# Patient Record
Sex: Male | Born: 1955 | Race: Black or African American | Hispanic: No | Marital: Married | Smoking: Never smoker
Health system: Southern US, Community
[De-identification: ages and names within clinical notes are randomized; demographics above are authoritative.]

## PROBLEM LIST (undated history)

## (undated) DIAGNOSIS — G4730 Sleep apnea, unspecified: Secondary | ICD-10-CM

## (undated) DIAGNOSIS — M1990 Unspecified osteoarthritis, unspecified site: Secondary | ICD-10-CM

## (undated) DIAGNOSIS — D649 Anemia, unspecified: Secondary | ICD-10-CM

## (undated) DIAGNOSIS — K219 Gastro-esophageal reflux disease without esophagitis: Secondary | ICD-10-CM

## (undated) DIAGNOSIS — G8929 Other chronic pain: Secondary | ICD-10-CM

## (undated) DIAGNOSIS — I723 Aneurysm of iliac artery: Secondary | ICD-10-CM

## (undated) DIAGNOSIS — M549 Dorsalgia, unspecified: Secondary | ICD-10-CM

## (undated) HISTORY — PX: CHOLECYSTECTOMY: SHX55

## (undated) HISTORY — DX: Aneurysm of iliac artery: I72.3

## (undated) HISTORY — DX: Other chronic pain: G89.29

## (undated) HISTORY — DX: Sleep apnea, unspecified: G47.30

## (undated) HISTORY — DX: Unspecified osteoarthritis, unspecified site: M19.90

## (undated) HISTORY — DX: Dorsalgia, unspecified: M54.9

## (undated) HISTORY — DX: Gastro-esophageal reflux disease without esophagitis: K21.9

## (undated) HISTORY — DX: Anemia, unspecified: D64.9

---

## 2010-04-15 ENCOUNTER — Ambulatory Visit
Admission: RE | Admit: 2010-04-15 | Discharge: 2010-04-15 | Payer: Self-pay | Source: Home / Self Care | Attending: Vascular Surgery | Admitting: Vascular Surgery

## 2010-04-15 ENCOUNTER — Ambulatory Visit: Admit: 2010-04-15 | Payer: Self-pay | Admitting: Vascular Surgery

## 2010-04-16 ENCOUNTER — Encounter
Admission: RE | Admit: 2010-04-16 | Discharge: 2010-04-16 | Payer: Self-pay | Source: Home / Self Care | Attending: Neurosurgery | Admitting: Neurosurgery

## 2010-04-24 NOTE — Procedures (Unsigned)
DUPLEX ULTRASOUND OF ABDOMINAL AORTA  INDICATION:  Left common iliac artery aneurysm.  HISTORY: Diabetes:  No. Cardiac:  No. Hypertension:  No. Smoking:  No. Connective Tissue Disorder: Family History:  No. Previous Surgery:  No.  DUPLEX EXAM:         AP (cm)                   TRANSVERSE (cm) Proximal             2.8  cm                   2.9 cm Mid                  2.2 cm                    2.2 cm Distal               2.1 cm                    2.3 cm Right Iliac          1.6 cm                    1.4 cm Left Iliac           2.5 cm                    2.7 cm  PREVIOUS:  Date: 03/12/2010 (MRI)  AP:  Left CIA = 2.8  TRANSVERSE: Left CIA = 2.8  IMPRESSION: 1. No evidence of abdominal aortic aneurysm noted. 2. Aneurysmal dilatation noted in the left common iliac artery, based     on limited visualization due to overlying bowel gas patterns and     patient body habitus. 3. No significant change in maximum diameter of the left common iliac     artery noted when compared to the previous MRI.  ___________________________________________ Janetta Hora. Fields, MD  CH/MEDQ  D:  04/15/2010  T:  04/15/2010  Job:  [id number]

## 2010-04-29 ENCOUNTER — Encounter
Admission: RE | Admit: 2010-04-29 | Discharge: 2010-04-29 | Payer: Self-pay | Source: Home / Self Care | Attending: Neurosurgery | Admitting: Neurosurgery

## 2010-05-03 ENCOUNTER — Other Ambulatory Visit: Payer: Self-pay | Admitting: Neurosurgery

## 2010-05-03 DIAGNOSIS — M5410 Radiculopathy, site unspecified: Secondary | ICD-10-CM

## 2010-05-03 DIAGNOSIS — M549 Dorsalgia, unspecified: Secondary | ICD-10-CM

## 2010-05-21 ENCOUNTER — Inpatient Hospital Stay: Admission: RE | Admit: 2010-05-21 | Payer: Self-pay | Source: Ambulatory Visit

## 2010-05-28 ENCOUNTER — Ambulatory Visit
Admission: RE | Admit: 2010-05-28 | Discharge: 2010-05-28 | Disposition: A | Discharge status: Home / Self Care | Payer: 59 | Source: Ambulatory Visit | Attending: Neurosurgery | Admitting: Neurosurgery

## 2010-05-28 DIAGNOSIS — M549 Dorsalgia, unspecified: Secondary | ICD-10-CM

## 2010-05-28 DIAGNOSIS — M5410 Radiculopathy, site unspecified: Secondary | ICD-10-CM

## 2010-08-17 NOTE — Assessment & Plan Note (Signed)
OFFICE VISIT   BRYLEY, KOVACEVIC  DOB:  1955-05-27                                       04/15/2010  EAVWU#:[id number]   CHIEF COMPLAINT:  Iliac aneurysm.   HISTORY OF PRESENT ILLNESS:  The patient is a 55 year old male referred  by Dr. Benson Setting for evaluation of iliac artery aneurysm.  Aneurysm was  found incidentally on an MRI workup for back pain.  The patient denies  any family history of aneurysms.  He denies any abdominal pain.  He does  have chronic back pain.   He stated this was the initial diagnosis of his aneurysm.  Chronic  medical problems include gallop, sleep apnea and chronic leg swelling of  his left leg.  All of these problems are currently controlled and  followed by Dr. Benson Setting.  He denies history of diabetes or hypertension.Marland Kitchen   PAST SURGICAL HISTORY:  Cholecystectomy.   SOCIAL HISTORY:  He works as a Veterinary surgeon.  He is married and has 2  children.  He is a nonsmoker. He is a Tax adviser of alcohol.   FAMILY HISTORY:  Unremarkable for aneurysm or vascular disease at age  less than 37.   REVIEW OF SYSTEMS:  A full 12 point review of systems was performed with  the patient today,  GENERAL:  He is 5 foot 10 inches, 260 pounds.  GI:  Has history of reflux.  MUSCULOSKELETAL:  He has multiple joint arthritis pain.  All other systems are negative.   MEDICATIONS:  1. Lasix 40 mg twice a day.  2. Colchicine p.r.n.  3. Tramadol p.r.n.   ALLERGIES:  He has no known drug allergies.   PHYSICAL EXAMINATION:  Blood pressure 133/87 in the left arm, heart rate  is 86 and regular.  Temperature is 98.2.  HEENT:  Unremarkable.  Neck;  Has 2+ carotid pulses without bruit.  Chest:  Clear to auscultation.  Cardiac:  Regular rate and rhythm without murmur.  Abdomen:  Obese,  soft, nontender, nondistended.  No masses.  Extremities:  He has 2+  radial, 2+ femoral pulses bilaterally.  He has absent pedal pulses in  the right foot.  He has a 2+ dorsalis pedis  pulse in the left foot.  He  does have some deformity of right foot from previous crush injury.  Neurologic exam:  He has symmetric upper extremity and lower extremity.  Motor strength is 5/5.  Skin:  Has no open ulcers or rashes.  He does  have chronic edema in the left lower extremity which is slightly greater  than the right lower extremity.   He had an aortic ultrasound today which shows the abdominal aorta is of  normal diameter , maximum of 2.9 cm.  The left common iliac artery was  dilated at 2.7 cm compared to his MRI exam of 2.8 cm.  The right iliac  artery was normal caliber.   In summary, this patient has a small left common iliac artery aneurysm  that is currently 2.7 cm in diameter by ultrasound versus 2.8 by MRI  scan.  If the iliac aneurysm grows to the size of 3 to 3.5 cm in  diameter, we would consider repair at that time.  I believe the best  option for now is continued observation, control of his atherosclerotic  risk factors.  He will follow up in 6 months'  time with a CT angiogram  at that time to further define the extent of the aneurysm and see  whether or not this would be amendable to stent graft repair at some  point in the future if he requires this.  The patient was also informed  he should let anyone know in the emergency room if he has abdominal pain  or unrelenting worsening back pain that he has known iliac aneurysm.     Janetta Hora. Fields, MD  Electronically Signed   CEF/MEDQ  D:  04/15/2010  T:  04/16/2010  Job:  [id number]   cc:   Marcello Moores, MD  Dorise Hiss, M.D.

## 2010-09-06 ENCOUNTER — Other Ambulatory Visit: Payer: Self-pay | Admitting: Vascular Surgery

## 2010-09-06 DIAGNOSIS — I723 Aneurysm of iliac artery: Secondary | ICD-10-CM

## 2010-09-10 ENCOUNTER — Encounter: Payer: Self-pay | Admitting: Vascular Surgery

## 2010-10-14 ENCOUNTER — Ambulatory Visit: Payer: 59 | Admitting: Vascular Surgery

## 2010-10-14 ENCOUNTER — Other Ambulatory Visit: Payer: No Typology Code available for payment source

## 2010-11-04 ENCOUNTER — Other Ambulatory Visit: Payer: No Typology Code available for payment source

## 2010-11-04 ENCOUNTER — Ambulatory Visit: Payer: No Typology Code available for payment source | Admitting: Vascular Surgery

## 2011-12-19 ENCOUNTER — Encounter: Payer: Self-pay | Admitting: Vascular Surgery

## 2012-05-14 ENCOUNTER — Other Ambulatory Visit: Payer: Self-pay | Admitting: *Deleted

## 2012-05-14 DIAGNOSIS — I723 Aneurysm of iliac artery: Secondary | ICD-10-CM

## 2012-06-06 ENCOUNTER — Encounter: Payer: Self-pay | Admitting: Vascular Surgery

## 2012-06-07 ENCOUNTER — Ambulatory Visit
Admission: RE | Admit: 2012-06-07 | Discharge: 2012-06-07 | Disposition: A | Discharge status: Home / Self Care | Payer: BC Managed Care – PPO | Source: Ambulatory Visit | Attending: Vascular Surgery | Admitting: Vascular Surgery

## 2012-06-07 ENCOUNTER — Other Ambulatory Visit: Payer: No Typology Code available for payment source

## 2012-06-07 ENCOUNTER — Encounter: Payer: Self-pay | Admitting: Vascular Surgery

## 2012-06-07 ENCOUNTER — Ambulatory Visit (INDEPENDENT_AMBULATORY_CARE_PROVIDER_SITE_OTHER): Payer: BC Managed Care – PPO | Admitting: Vascular Surgery

## 2012-06-07 VITALS — BP 157/112 | HR 76 | Ht 70.0 in | Wt 310.5 lb

## 2012-06-07 DIAGNOSIS — I723 Aneurysm of iliac artery: Secondary | ICD-10-CM

## 2012-06-07 DIAGNOSIS — R634 Abnormal weight loss: Secondary | ICD-10-CM

## 2012-06-07 IMAGING — CT CT CTA ABD/PEL W/CM AND/OR W/O CM
2 of 8 series · 15 of 46 positions shown, 17 images · IV contrast (omnipaque)
Comparison: Lumbar spine MRI - [DATE]

CLINICAL DATA: Evaluate iliac artery aneurysm

CT ANGIOGRAPHY ABDOMEN AND PELVIS
TECHNIQUE: Multidetector CT imaging of the abdomen and pelvis was
performed using the standard protocol during bolus administration
of intravenous contrast.  Multiplanar reconstructed images
including MIPs were obtained and reviewed to evaluate the vascular
anatomy.
Contrast: 125mL OMNIPAQUE IOHEXOL 350 MG/ML SOLN

[Series 3: cta a/p · axial · 0.93mm/px · z∈[-464,-17]mm · 12 of 199 slices shown, 14 images]
[im 10/199  soft-tissue]
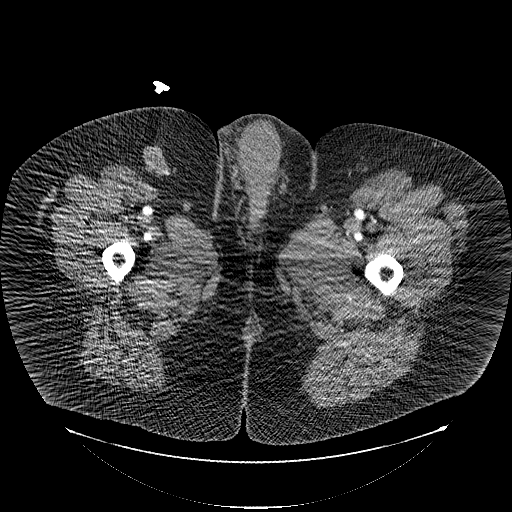
[im 10/199  bone]
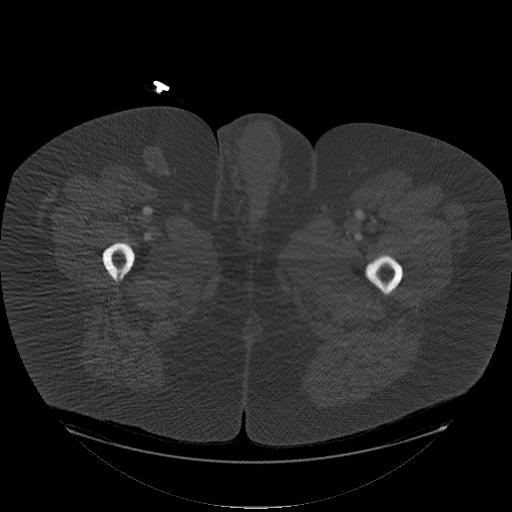
[im 29/199  soft-tissue]
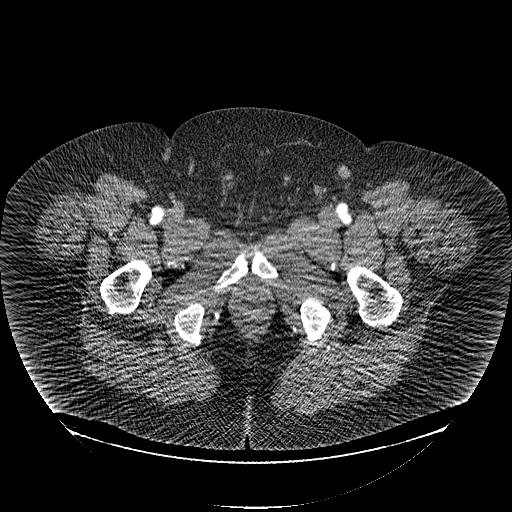
[im 48/199  soft-tissue]
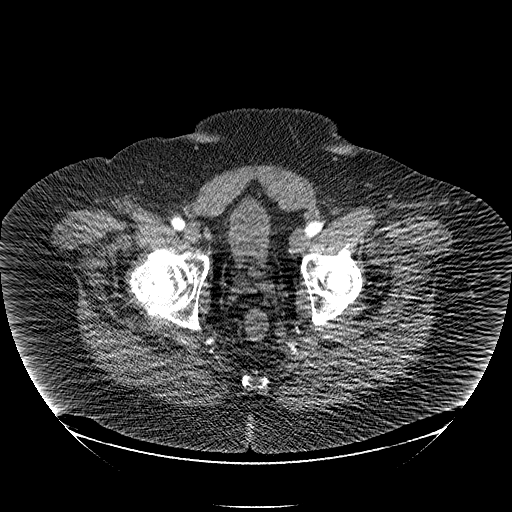
[im 57/199  soft-tissue]
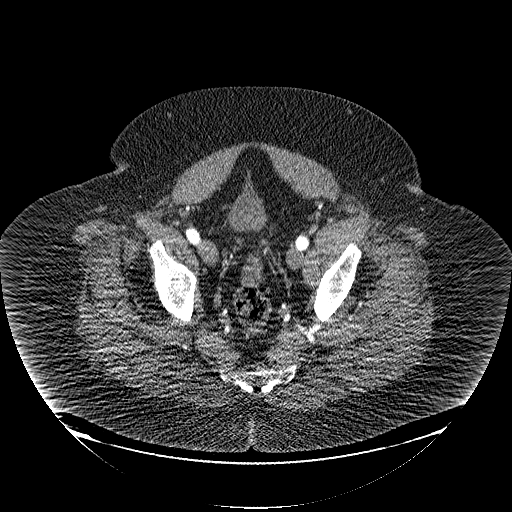
[im 76/199  soft-tissue]
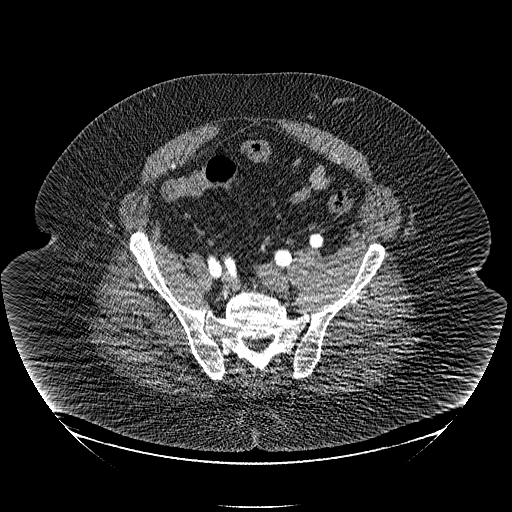
[im 95/199  soft-tissue]
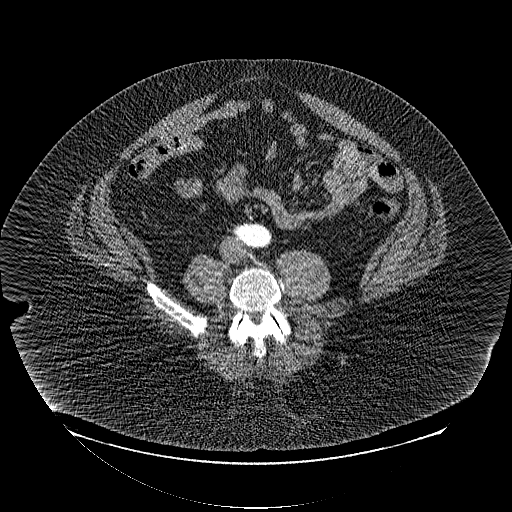
[im 104/199  soft-tissue]
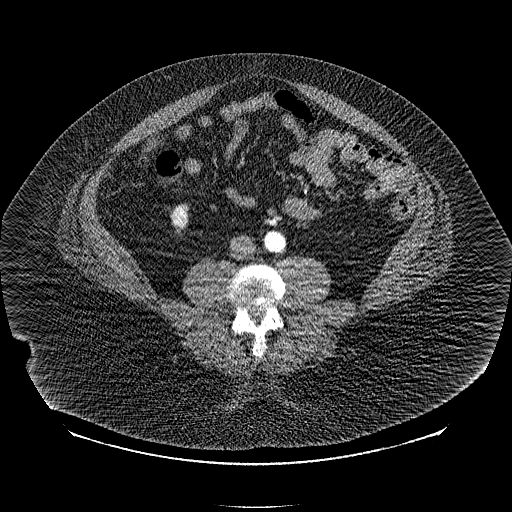
[im 123/199  soft-tissue]
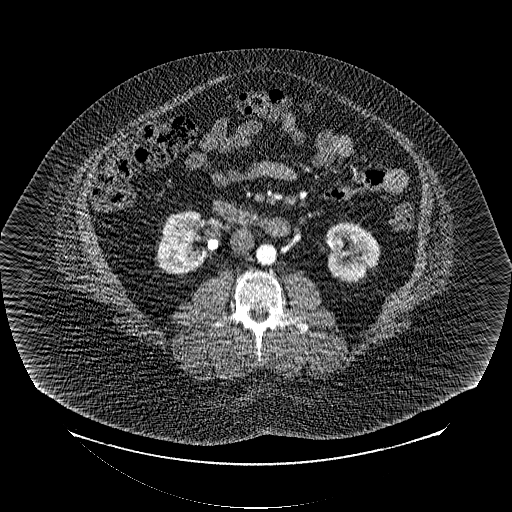
[im 142/199  soft-tissue]
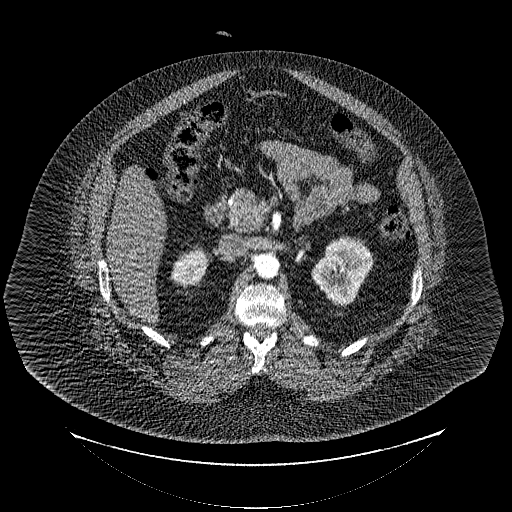
[im 142/199  bone]
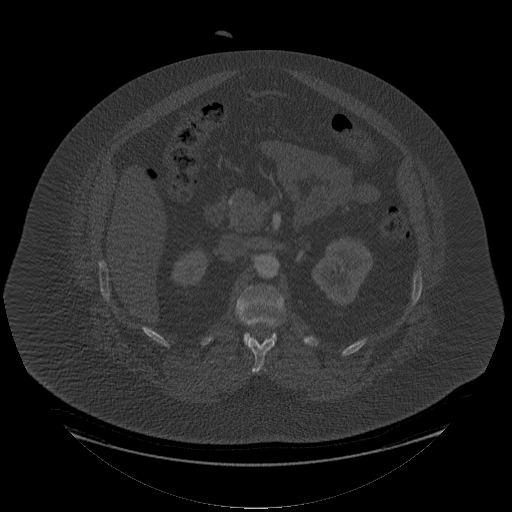
[im 151/199  soft-tissue]
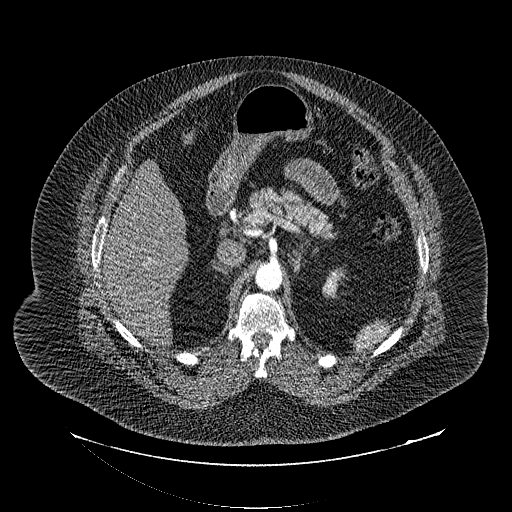
[im 170/199  soft-tissue]
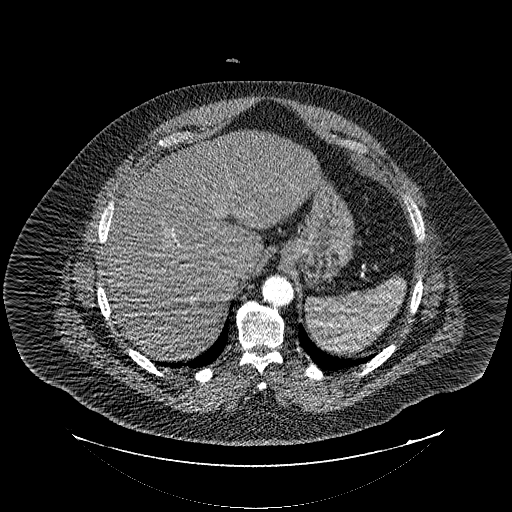
[im 189/199  soft-tissue]
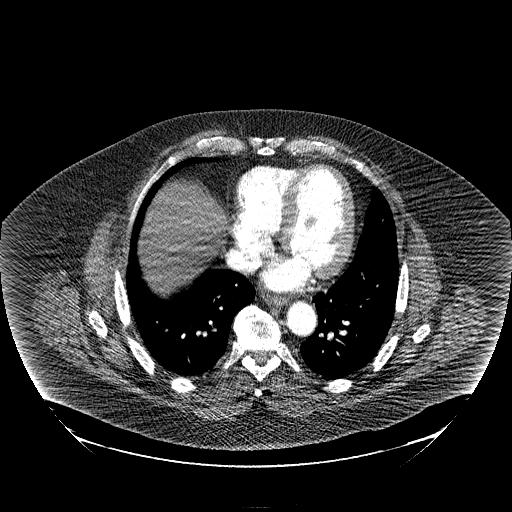

[Series 400: cor · coronal · 0.99mm/px · 3 of 195 slices shown]
[im 49/195  soft-tissue]
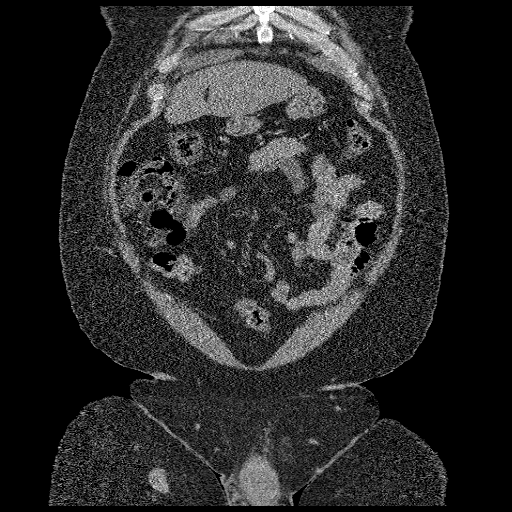
[im 98/195  soft-tissue]
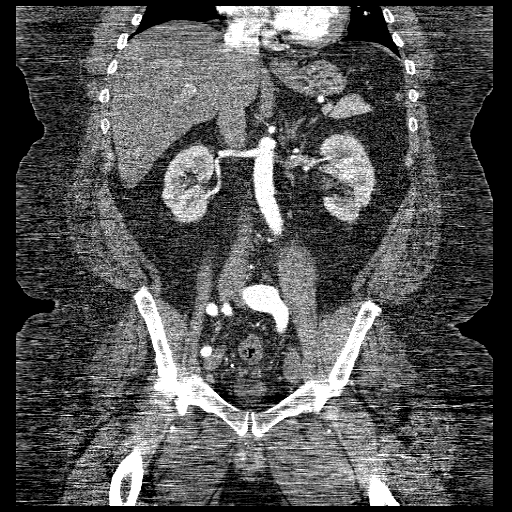
[im 146/195  soft-tissue]
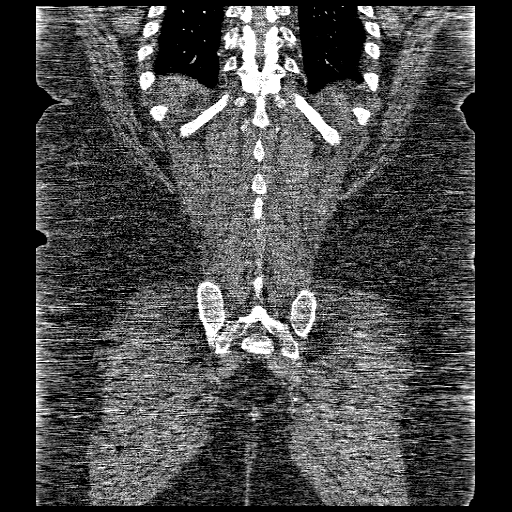

[15 of 46 positions shown; findings below may reference images not displayed]

Vascular Findings:

Abdominal aorta:  There is no significant atherosclerotic plaque
within the normal caliber abdominal aorta.  No abdominal aortic
dissection or periaortic stranding.

Celiac artery:  Widely patent without hemodynamically significant
stenosis.

SMA:  Widely patent without hemodynamically significant stenosis
incidental note is made of complete replacement of the right and
left hepatic arterial system from the SMA.

Right renal artery:  Solitary; widely patent.

Left renal artery: Duplicated - there is a smaller accessory left
sided renal artery which arises from the more caudal aspect of the
abdominal aorta which supplies the inferior pole of the left
kidney.  Both left-sided renal arteries are widely patent.

IMA:  Widely patent.

Right-sided pelvic vasculature:  The right common and external
iliac arteries are artery noted to be mildly tortuous but of normal
caliber and widely patent.  The right internal iliac artery is
noted to be mildly ectatic distally but widely patent without
hemodynamically significant narrowing.

Left-sided pelvic vasculature: There is grossly unchanged fusiform
aneurysmal dilatation of the left common iliac artery measuring
approximately 2.7 cm in greatest oblique axial dimension (image
116, series 3) and approximately 2.8 cm in greatest oblique coronal
dimension (image 98, series 400).  There is fusiform ectasia of the
origin of the left internal iliac artery measuring approximately
1.5 cm in greatest oblique coronal dimension (image 100, series
400).  The left internal iliac artery then tapers to a normal
caliber shortly after its origin.  The left external iliac artery
is of normal caliber and widely patent throughout its course.

Review of the MIP images confirms the above findings.

-------------------------------------------

Nonvascular findings:

Normal hepatic contour.  No discrete hepatic lesions.  Post
cholecystectomy.  No intra or extrahepatic biliary duct dilatation.
No ascites.

There is symmetric enhancement excretion of the bilateral kidneys.
No discrete renal lesions.  There are three discrete nonobstructing
stones within the right renal pelvis the largest of which measures
approximately 1.1 x 0.8 cm (coronal image 94, series 400). No
urinary obstruction or perinephric stranding.  Normal appearance of
the bilateral adrenal glands, pancreas and spleen.  Incidental note
is made of several small splenules.

Colonic diverticulosis without evidence of diverticulitis.  The
bowel is otherwise normal in course and caliber without wall
thickening or evidence of obstruction.  Normal appearance of the
appendix.  No pneumoperitoneum, pneumatosis or portal venous gas.

Scattered shoddy port hepatis and retroperitoneal lymph nodes are
not enlarged by CT criteria.  No retroperitoneal, mesenteric,
pelvic or inguinal lymphadenopathy.

Pelvic organs are normal.  No free fluid in the pelvis.

Limited visualization of the lower thorax demonstrates grossly
symmetric dependent atelectasis.  No focal airspace opacity.  No
pleural effusion.  Borderline cardiomegaly.  No pericardial
effusion.

No acute or aggressive osseous abnormalities.  Multilevel mild to
moderate DDD, worse at L5 - S1.  Bilateral hip degenerative change,
right greater than left.
IMPRESSION: 1.  Grossly unchanged fusiform aneurysmal dilatation of the left
common iliac artery measuring approximately 2.8 cm in diameter.
There is mild fusiform ectasia of the origin of the left internal
iliac artery measuring approximately 1.5 cm in diameter tapering to
a normal caliber shortly after its origin.  The left external iliac
artery is widely patent and of normal caliber.

2.  Right-sided nonobstructing nephrolithiasis.

3.  Mild to moderate multilevel lumbar spine DDD, worsened L5 - S1.
4.  Bilateral hip degenerative change, right greater than left.

## 2012-06-07 MED ORDER — IOHEXOL 350 MG/ML SOLN
125.0000 mL | Freq: Once | INTRAVENOUS | Status: AC | PRN
  Administered 2012-06-07: 125 mL via INTRAVENOUS

## 2012-06-07 NOTE — Progress Notes (Signed)
HISTORY OF PRESENT ILLNESS: The patient is a 57 year old male referred by Dr. Benson Setting for evaluation of iliac artery aneurysm. He returns today for followup. He was last seen 2 years ago. Aneurysm was  found incidentally on an MRI workup for back pain. The patient denies any family history of aneurysms. He denies any abdominal pain. He does have chronic back pain and this is stable.  Chronic medical problems include obesity, sleep apnea and chronic leg swelling of his left leg. All of these problems are currently controlled and followed by Dr. Benson Setting. He denies history of diabetes or hypertension. He has tried to lose some weight but has been unsuccessful.   Past Medical History  Diagnosis Date  . GERD (gastroesophageal reflux disease)   . Arthritis   . Iliac artery aneurysm   . Chronic back pain   . Sleep apnea   . Iliac artery aneurysm, left   . Anemia    Past Surgical History  Procedure Laterality Date  . Cholecystectomy     Current Outpatient Prescriptions on File Prior to Visit  Medication Sig Dispense Refill  . colchicine 0.6 MG tablet Take 0.6 mg by mouth as needed.        . furosemide (LASIX) 40 MG tablet Take 40 mg by mouth 2 (two) times daily.        . traMADol (ULTRAM) 50 MG tablet Take 50 mg by mouth every 6 (six) hours as needed.         No current facility-administered medications on file prior to visit.   ALLERGIES: He has no known drug allergies.   Review of systems: He denies shortness of breath. He denies chest pain.  PHYSICAL EXAMINATION: Filed Vitals:   06/07/12 1117  BP: 157/112  Pulse: 76  Height: 5\' 10"  (1.778 m)  Weight: 310 lb 8 oz (140.842 kg)  SpO2: 100%      HEENT: Unremarkable.  Neck;2+ carotid pulses without bruit.  Chest: Clear to auscultation.  Cardiac: Regular rate and rhythm without murmur.  Abdomen: Obese, soft, nontender, nondistended. No masses.  Extremities: He has 2+ radial, 2+ femoral pulses bilaterally. He has absent pedal pulses in  the right foot. He has a 2+ dorsalis pedis pulse in the left foot. He does have some deformity of right foot from previous crush injury.  Neurologic exam: He has symmetric upper extremity and lower extremity. Motor strength is 5/5.  Skin: Has no open ulcers or rashes. He does have chronic edema in the left lower extremity which is slightly greater than the right lower extremity.   Data: CT scan of abdomen and pelvis today was reviewed. This shows a left common iliac aneurysm is 2.8 cm. prior ultrasound showed The left common iliac artery was  dilated at 2.7 cm compared to his MRI exam of 2.8 cm. The right iliac artery was normal caliber.   Assessment: Stable left common iliac aneurysm. Morbid obesity.  Plan: He will follow up in one year with a CT angiogram.  The patient was also informed he should let anyone know in the emergency room if he has abdominal pain or unrelenting worsening back pain that he has known iliac aneurysm.    The patient was also referred to [facility] surgery for evaluation for possible weight loss reduction surgery at his request  Fabienne Bruns, MD [facility] Office: [phone] Pager: [phone]

## 2012-06-11 NOTE — Addendum Note (Signed)
Addended by: Sharee Pimple on: 06/11/2012 01:10 PM   Modules accepted: Orders

## 2012-08-21 ENCOUNTER — Telehealth: Payer: Self-pay | Admitting: *Deleted

## 2012-08-21 NOTE — Telephone Encounter (Signed)
Patient has been monitoring his BP readings since his last visit with [place]; averages around 170 / 102 per pt. He does not have a PCP now since his doctor in [place], [place] left town. I discussed the need for getting his medical issues under control and advised him to seek a new Primary doctor asap. He said that he couldn't make it to the appt we set up for him with [facility] to discuss possible weight loss surgery. He has not rescheduled. He voiced understanding that he needs to have continued monitoring of his medical problems including sleep apnea, arthritis, edema, SOB, and HTN. He will find a PCP.

## 2012-09-07 ENCOUNTER — Other Ambulatory Visit: Payer: No Typology Code available for payment source

## 2013-06-13 ENCOUNTER — Inpatient Hospital Stay: Admission: RE | Admit: 2013-06-13 | Payer: BC Managed Care – PPO | Source: Ambulatory Visit

## 2013-06-13 ENCOUNTER — Ambulatory Visit: Payer: BC Managed Care – PPO | Admitting: Vascular Surgery

## 2013-06-20 ENCOUNTER — Ambulatory Visit: Payer: BC Managed Care – PPO | Admitting: Vascular Surgery

## 2013-07-10 ENCOUNTER — Encounter: Payer: Self-pay | Admitting: Vascular Surgery

## 2013-07-11 ENCOUNTER — Ambulatory Visit: Payer: BC Managed Care – PPO | Admitting: Vascular Surgery

## 2013-07-12 ENCOUNTER — Other Ambulatory Visit: Payer: Self-pay | Admitting: *Deleted

## 2013-07-12 DIAGNOSIS — I723 Aneurysm of iliac artery: Secondary | ICD-10-CM

## 2013-08-15 ENCOUNTER — Ambulatory Visit: Payer: BC Managed Care – PPO | Admitting: Vascular Surgery

## 2013-08-15 ENCOUNTER — Other Ambulatory Visit: Payer: BC Managed Care – PPO

## 2013-09-04 ENCOUNTER — Encounter: Payer: Self-pay | Admitting: Vascular Surgery

## 2013-09-05 ENCOUNTER — Other Ambulatory Visit: Payer: BC Managed Care – PPO

## 2013-09-05 ENCOUNTER — Ambulatory Visit: Payer: BC Managed Care – PPO | Admitting: Vascular Surgery

## 2013-09-30 ENCOUNTER — Ambulatory Visit
Admission: RE | Admit: 2013-09-30 | Discharge: 2013-09-30 | Disposition: A | Discharge status: Home / Self Care | Payer: BC Managed Care – PPO | Source: Ambulatory Visit | Attending: Vascular Surgery | Admitting: Vascular Surgery

## 2013-09-30 DIAGNOSIS — I723 Aneurysm of iliac artery: Secondary | ICD-10-CM

## 2013-09-30 MED ORDER — IOHEXOL 350 MG/ML SOLN
100.0000 mL | Freq: Once | INTRAVENOUS | Status: AC | PRN
  Administered 2013-09-30: 100 mL via INTRAVENOUS

## 2013-10-02 ENCOUNTER — Encounter: Payer: Self-pay | Admitting: Vascular Surgery

## 2013-10-03 ENCOUNTER — Encounter: Payer: Self-pay | Admitting: Vascular Surgery

## 2013-10-03 ENCOUNTER — Ambulatory Visit (INDEPENDENT_AMBULATORY_CARE_PROVIDER_SITE_OTHER): Payer: BC Managed Care – PPO | Admitting: Vascular Surgery

## 2013-10-03 VITALS — BP 122/78 | HR 72 | Ht 70.0 in | Wt 303.6 lb

## 2013-10-03 DIAGNOSIS — I723 Aneurysm of iliac artery: Secondary | ICD-10-CM

## 2013-10-03 NOTE — Progress Notes (Signed)
HISTORY OF PRESENT ILLNESS: The patient is a 58 year old male referred by Dr. Benson Settingesfai for evaluation of iliac artery aneurysm. He returns today for followup. He was last seen March 2014.   Aneurysm was found incidentally on an MRI workup for back pain in 2012. The patient denies any family history of aneurysms. He denies any abdominal pain. He does have chronic back pain and this is stable.   Chronic medical problems include obesity, sleep apnea and chronic leg swelling of his left leg. All of these problems are currently controlled. He denies history of diabetes or hypertension. He has tried to lose some weight but has been unsuccessful.    Past Medical History  Diagnosis Date  . GERD (gastroesophageal reflux disease)   . Arthritis   . Iliac artery aneurysm   . Chronic back pain   . Sleep apnea   . Iliac artery aneurysm, left   . Anemia    Past Surgical History  Procedure Laterality Date  . Cholecystectomy     Current Outpatient Prescriptions on File Prior to Visit  Medication Sig Dispense Refill  . colchicine 0.6 MG tablet Take 0.6 mg by mouth as needed.        . furosemide (LASIX) 40 MG tablet Take 40 mg by mouth 2 (two) times daily.        Marland Kitchen. oxyCODONE-acetaminophen (PERCOCET) 7.5-325 MG per tablet Take 1 tablet by mouth every 6 (six) hours as needed.      . traMADol (ULTRAM) 50 MG tablet Take 50 mg by mouth every 6 (six) hours as needed.         No current facility-administered medications on file prior to visit.     ALLERGIES: He has no known drug allergies.   Review of systems: He denies shortness of breath. He denies chest pain.  PHYSICAL EXAMINATION:  Filed Vitals:   10/03/13 1022  BP: 122/78  Pulse: 72  Height: 5\' 10"  (1.778 m)  Weight: 303 lb 9.6 oz (137.712 kg)  SpO2: 98%    HEENT: Unremarkable.   Neck;2+ carotid pulses without bruit.   Chest: Clear to auscultation.   Cardiac: Regular rate and rhythm without murmur.   Abdomen: Obese, soft, nontender,  nondistended. No masses.   Extremities: He has 2+ radial, 2+ femoral pulses bilaterally. He has absent pedal pulses in the right foot. He has a 2+ dorsalis pedis pulse in the left foot. He does have some deformity of right foot from previous crush injury.   Neurologic exam: He has symmetric upper extremity and lower extremity. Motor strength is 5/5.   Skin: Has no open ulcers or rashes. He does have chronic edema in the left lower extremity which is slightly greater than the right lower extremity.   Data: CT scan of abdomen and pelvis today was reviewed. This shows a left common iliac aneurysm is 2.8 cm. prior ultrasound showed The left common iliac artery was dilated at 2.7 cm compared to his MRI exam of 2.8 cm. The right iliac artery was normal caliber.   Assessment: Stable left common iliac aneurysm. Morbid obesity.  Plan: He will follow up in one year with a CT angiogram.  If his aneurysm is stable at that time we may consider spacing his appointments to every few years since it has really had no change in the last 3 years. The patient was also informed he should let anyone know in the emergency room if he has abdominal pain or unrelenting worsening back pain that he has  known iliac aneurysm.    Fabienne Brunsharles Lindaann Gradilla, MD [facility] [place] Office: [phone] Pager: [phone]

## 2014-09-18 ENCOUNTER — Telehealth: Payer: Self-pay | Admitting: Vascular Surgery

## 2014-09-18 NOTE — Telephone Encounter (Signed)
I have left a message for Eduardo Sanchez to call me so that I may explain the denial from his insurance company for his CTA, dpm

## 2014-09-18 NOTE — Telephone Encounter (Signed)
-----   Message from Kateri Mc sent at 09/15/2014 11:14 AM EDT ----- Regarding: FW:  CTA scheduled for 10/02/14 Annabelle Harman, I say we can cancel this appointment.  Debbie   ----- Message -----    From: Sherren Kerns, MD    Sent: 09/15/2014  10:52 AM      To: Kateri Mc Subject: RE:  CTA scheduled for 10/02/14                 No reason to see [place] unless he has a CT  Leonette Most ----- Message -----    From: Kateri Mc    Sent: 09/12/2014   2:36 PM      To: Sherren Kerns, MD Subject: RE:  CTA scheduled for 10/02/14                 Dr. Darrick Penna, just to clarify, do you want the patient to keep his appointment with Rosalita Chessman and reschedule the CTA to next year?  I cannot request pre-cert that far out, but we can try again closer to that time.  Thanks,  Debbie    ----- Message -----    From: Sherren Kerns, MD    Sent: 09/12/2014  12:45 PM      To: Kateri Mc Subject: RE:  CTA scheduled for 10/02/14                 See if they will approve 2 years  Leonette Most ----- Message -----    From: Kateri Mc    Sent: 09/11/2014   2:57 PM      To: Sherren Kerns, MD Subject:  CTA scheduled for 10/02/14                     Dr. Darrick Penna, the CTA abdomen and pelvis scheduled 10/02/14 denied stating it is not medically necessary.  You can do a peer-to-peer by calling [phone].  You will need: patient ID [id number], DOB November 07, 1955, and patient name to go directly to review.  You may also need your NPI [id number].  Thank you,  Eunice Blase

## 2014-10-02 ENCOUNTER — Other Ambulatory Visit: Payer: BC Managed Care – PPO

## 2014-10-02 ENCOUNTER — Ambulatory Visit: Payer: Self-pay | Admitting: Family

## 2014-10-09 ENCOUNTER — Ambulatory Visit: Payer: BC Managed Care – PPO | Admitting: Family

## 2019-04-18 ENCOUNTER — Telehealth: Payer: Self-pay | Admitting: Nurse Practitioner

## 2019-04-18 ENCOUNTER — Other Ambulatory Visit: Payer: Self-pay | Admitting: Neurosurgery

## 2019-04-18 DIAGNOSIS — M5416 Radiculopathy, lumbar region: Secondary | ICD-10-CM

## 2019-04-18 NOTE — Telephone Encounter (Signed)
Phone call to patient to verify medication list and allergies for myelogram procedure. Pt aware he will not need to hold any medications for this procedure. Pre and post procedure instructions reviewed with pt. Pt verbalized understanding.  

## 2019-04-29 ENCOUNTER — Ambulatory Visit
Admission: RE | Admit: 2019-04-29 | Discharge: 2019-04-29 | Disposition: A | Discharge status: Home / Self Care | Payer: BLUE CROSS/BLUE SHIELD | Source: Ambulatory Visit | Attending: Neurosurgery | Admitting: Neurosurgery

## 2019-04-29 ENCOUNTER — Other Ambulatory Visit: Payer: Self-pay

## 2019-04-29 DIAGNOSIS — M5416 Radiculopathy, lumbar region: Secondary | ICD-10-CM

## 2019-04-29 MED ORDER — ONDANSETRON HCL 4 MG/2ML IJ SOLN
4.0000 mg | Freq: Once | INTRAMUSCULAR | Status: AC
  Administered 2019-04-29: 4 mg via INTRAMUSCULAR

## 2019-04-29 MED ORDER — MEPERIDINE HCL 100 MG/ML IJ SOLN
75.0000 mg | Freq: Once | INTRAMUSCULAR | Status: AC
  Administered 2019-04-29: 75 mg via INTRAMUSCULAR

## 2019-04-29 MED ORDER — DIAZEPAM 5 MG PO TABS
5.0000 mg | ORAL_TABLET | Freq: Once | ORAL | Status: AC
  Administered 2019-04-29: 5 mg via ORAL

## 2019-04-29 MED ORDER — IOPAMIDOL (ISOVUE-M 200) INJECTION 41%
15.0000 mL | Freq: Once | INTRAMUSCULAR | Status: AC
  Administered 2019-04-29: 15 mL via INTRATHECAL

## 2019-04-29 MED ORDER — DIAZEPAM 5 MG PO TABS: 10.0000 mg | ORAL_TABLET | Freq: Once | ORAL | Status: DC

## 2019-04-29 NOTE — Discharge Instructions (Signed)

## 2023-11-03 DEATH — deceased
